# Patient Record
Sex: Male | Born: 2005 | Race: Black or African American | Hispanic: No | Marital: Single | Smoking: Never smoker
Health system: Southern US, Community
[De-identification: ages and names within clinical notes are randomized; demographics above are authoritative.]

---
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
## 2024-01-08 DIAGNOSIS — H5213 Myopia, bilateral: Secondary | ICD-10-CM | POA: Diagnosis not present
